# Patient Record
Sex: Female | Born: 1995 | Hispanic: No | Marital: Married | Smoking: Never smoker
Health system: Southern US, Community
[De-identification: ages and names within clinical notes are randomized; demographics above are authoritative.]

## PROBLEM LIST (undated history)

## (undated) DIAGNOSIS — E559 Vitamin D deficiency, unspecified: Secondary | ICD-10-CM

## (undated) HISTORY — PX: SKIN BIOPSY: SHX1

## (undated) HISTORY — DX: Vitamin D deficiency, unspecified: E55.9

---

## 2019-08-03 ENCOUNTER — Emergency Department (HOSPITAL_BASED_OUTPATIENT_CLINIC_OR_DEPARTMENT_OTHER): Payer: No Typology Code available for payment source

## 2019-08-03 ENCOUNTER — Emergency Department (HOSPITAL_BASED_OUTPATIENT_CLINIC_OR_DEPARTMENT_OTHER)
Admission: EM | Admit: 2019-08-03 | Discharge: 2019-08-04 | Disposition: A | Discharge status: Home / Self Care | Payer: No Typology Code available for payment source | Attending: Emergency Medicine | Admitting: Emergency Medicine

## 2019-08-03 ENCOUNTER — Encounter (HOSPITAL_BASED_OUTPATIENT_CLINIC_OR_DEPARTMENT_OTHER): Payer: Self-pay

## 2019-08-03 ENCOUNTER — Other Ambulatory Visit: Payer: Self-pay

## 2019-08-03 DIAGNOSIS — R0789 Other chest pain: Secondary | ICD-10-CM | POA: Insufficient documentation

## 2019-08-03 DIAGNOSIS — R1084 Generalized abdominal pain: Secondary | ICD-10-CM

## 2019-08-03 DIAGNOSIS — R109 Unspecified abdominal pain: Secondary | ICD-10-CM | POA: Diagnosis present

## 2019-08-03 LAB — COMPREHENSIVE METABOLIC PANEL
ALT: 19 U/L (ref 0–44)
AST: 22 U/L (ref 15–41)
Albumin: 4.8 g/dL (ref 3.5–5.0)
Alkaline Phosphatase: 48 U/L (ref 38–126)
Anion gap: 9 (ref 5–15)
BUN: 11 mg/dL (ref 6–20)
CO2: 26 mmol/L (ref 22–32)
Calcium: 9.3 mg/dL (ref 8.9–10.3)
Chloride: 103 mmol/L (ref 98–111)
Creatinine, Ser: 0.64 mg/dL (ref 0.44–1.00)
GFR calc Af Amer: >60 mL/min (ref >60)
GFR calc non Af Amer: >60 mL/min (ref >60)
Glucose, Bld: 103 mg/dL — ABNORMAL HIGH (ref 70–99)
Potassium: 4.1 mmol/L (ref 3.5–5.1)
Sodium: 138 mmol/L (ref 135–145)
Total Bilirubin: 0.6 mg/dL (ref 0.3–1.2)
Total Protein: 8.3 g/dL — ABNORMAL HIGH (ref 6.5–8.1)

## 2019-08-03 LAB — CBC
HCT: 40.7 % (ref 36.0–46.0)
Hemoglobin: 12.6 g/dL (ref 12.0–15.0)
MCH: 25.8 pg — ABNORMAL LOW (ref 26.0–34.0)
MCHC: 31 g/dL (ref 30.0–36.0)
MCV: 83.2 fL (ref 80.0–100.0)
Platelets: 300 10*3/uL (ref 150–400)
RBC: 4.89 MIL/uL (ref 3.87–5.11)
RDW: 13.1 % (ref 11.5–15.5)
WBC: 8.1 10*3/uL (ref 4.0–10.5)
nRBC: 0 % (ref 0.0–0.2)

## 2019-08-03 LAB — LIPASE, BLOOD: Lipase: 29 U/L (ref 11–51)

## 2019-08-03 LAB — TROPONIN I (HIGH SENSITIVITY): Troponin I (High Sensitivity): <2 ng/L (ref <18)

## 2019-08-03 LAB — PREGNANCY, URINE: Preg Test, Ur: NEGATIVE

## 2019-08-03 MED ORDER — ONDANSETRON HCL 4 MG/2ML IJ SOLN
4.0000 mg | Freq: Once | INTRAMUSCULAR | Status: AC
  Administered 2019-08-03: 4 mg via INTRAVENOUS
  Filled 2019-08-03: qty 2

## 2019-08-03 MED ORDER — MORPHINE SULFATE (PF) 2 MG/ML IV SOLN
2.0000 mg | Freq: Once | INTRAVENOUS | Status: AC
  Administered 2019-08-03: 2 mg via INTRAVENOUS
  Filled 2019-08-03: qty 1

## 2019-08-03 MED ORDER — IOHEXOL 300 MG/ML  SOLN
100.0000 mL | Freq: Once | INTRAMUSCULAR | Status: AC | PRN
  Administered 2019-08-03: 100 mL via INTRAVENOUS

## 2019-08-03 NOTE — Discharge Instructions (Signed)
Follow up with your doctor.  Return for worsening pain, fever, inability to eat or drink.  

## 2019-08-03 NOTE — ED Triage Notes (Signed)
Pt started having nausea yesterday followed by RLQ abd pain and mid sternal CP. Denies vomiting or diarrhea. Pt states abd pain is better with bending over. CP is better with rest. Both pains feel "achy" but gets "sharp" at times.

## 2019-08-03 NOTE — ED Provider Notes (Signed)
[facility] EMERGENCY DEPARTMENT Provider Note   CSN: [id number] Arrival date & time: 08/03/19  2103     History Chief Complaint  Patient presents with  . Abdominal Pain  . Chest Pain    Barbara Newman is a 24 y.o. female presenting for evaluation of abdominal pain, nausea.  Patient states yesterday she developed nausea and dull abdominal pain. Pain is in the right lower quadrant. Today her pain became sharp. Is worse with movement such as ambulation and bending. It is mild at rest. Patient states when her pain is severe she becomes more nauseous and then feels pain radiating from her stomach to her mid chest. She has no pain at other points. She denies fevers, chills, sore throat, cough, shortness of breath, left-sided abdominal pain, urinary symptoms, abnormal bowel movements. Patient states that when she strained to have a bowel movement in order to urinate today she had increased pain. She is sexually active with 1 female partner. They do not use condoms and she is not on birth control. She denies vaginal discharge, states he has no penile symptoms. She has not taken anything for her symptoms including Tylenol or ibuprofen. Denies previous history of abdominal surgeries. Her last period ended 6 days ago, it was normal for her.  HPI     History reviewed. No pertinent past medical history.  There are no problems to display for this patient.   History reviewed. No pertinent surgical history.   OB History   No obstetric history on file.     No family history on file.  Social History   Tobacco Use  . Smoking status: Never Smoker  . Smokeless tobacco: Never Used  Substance Use Topics  . Alcohol use: Never  . Drug use: Never    Home Medications Prior to Admission medications   Not on File    Allergies    Patient has no known allergies.  Review of Systems   Review of Systems  Gastrointestinal: Positive for abdominal pain and nausea.  All other systems  reviewed and are negative.   Physical Exam Updated Vital Signs BP 107/74   Pulse 69   Temp 98.7 F (37.1 C) (Oral)   Resp 15   Ht 5\' 6"  (1.676 m)   Wt 71.2 kg   LMP 07/28/2019   SpO2 100%   BMI 25.34 kg/m   Physical Exam Vitals and nursing note reviewed.  Constitutional:      General: She is not in acute distress.    Appearance: She is well-developed.  HENT:     Head: Normocephalic and atraumatic.  Eyes:     Conjunctiva/sclera: Conjunctivae normal.     Pupils: Pupils are equal, round, and reactive to light.  Cardiovascular:     Rate and Rhythm: Normal rate and regular rhythm.     Pulses: Normal pulses.  Pulmonary:     Effort: Pulmonary effort is normal. No respiratory distress.     Breath sounds: Normal breath sounds. No wheezing.  Abdominal:     General: There is no distension.     Palpations: Abdomen is soft. There is no mass.     Tenderness: There is abdominal tenderness. There is no guarding or rebound.     Comments: Tenderness palpation of right lower quadrant abdomen. No rigidity, guarding, distention. Negative rebound. Positive Rovsing's. Negative Murphy's. No CVA tenderness.  Musculoskeletal:        General: Normal range of motion.     Cervical back: Normal range of motion  and neck supple.  Skin:    General: Skin is warm and dry.     Capillary Refill: Capillary refill takes less than 2 seconds.  Neurological:     Mental Status: She is alert and oriented to person, place, and time.     ED Results / Procedures / Treatments   Labs (all labs ordered are listed, but only abnormal results are displayed) Labs Reviewed  CBC - Abnormal; Notable for the following components:      Result Value   MCH 25.8 (*)    All other components within normal limits  COMPREHENSIVE METABOLIC PANEL - Abnormal; Notable for the following components:   Glucose, Bld 103 (*)    Total Protein 8.3 (*)    All other components within normal limits  PREGNANCY, URINE  LIPASE, BLOOD    TROPONIN I (HIGH SENSITIVITY)    EKG EKG Interpretation  Date/Time:  Saturday August 03 2019 21:17:10 EST Ventricular Rate:  79 PR Interval:  160 QRS Duration: 78 QT Interval:  364 QTC Calculation: 417 R Axis:   70 Text Interpretation: Sinus rhythm with marked sinus arrhythmia Otherwise normal ECG No old tracing to compare Confirmed by Meridee Score [phone]) on 08/03/2019 9:21:51 PM   Radiology DG Chest 2 View  Result Date: 08/03/2019 CLINICAL DATA:  24 year old female with chest pain. EXAM: CHEST - 2 VIEW COMPARISON:  None. FINDINGS: The heart size and mediastinal contours are within normal limits. Both lungs are clear. The visualized skeletal structures are unremarkable. IMPRESSION: No active cardiopulmonary disease. Electronically Signed   By: Elgie Collard M.D.   On: 08/03/2019 21:42    Procedures Procedures (including critical care time)  Medications Ordered in ED Medications  ondansetron (ZOFRAN) injection 4 mg (4 mg Intravenous Given 08/03/19 2236)  morphine 2 MG/ML injection 2 mg (2 mg Intravenous Given 08/03/19 2236)  iohexol (OMNIPAQUE) 300 MG/ML solution 100 mL (100 mLs Intravenous Contrast Given 08/03/19 2303)    ED Course  I have reviewed the triage vital signs and the nursing notes.  Pertinent labs & imaging results that were available during my care of the patient were reviewed by me and considered in my medical decision making (see chart for details).    MDM Rules/Calculators/A&P                      Patient presenting for evaluation of nausea and abdominal pain. Physical exam shows patient who appears uncomfortable, but otherwise nontoxic. She has tenderness palpation the right lower quadrant with a positive Rovsing's. As she has associated nausea, consider appendicitis. Also consider ovarian cyst. Lower suspicion for torsion as pain is higher in the abdomen, not so much pelvic. Will obtain pregnancy to rule out ectopic. No vaginal discharge to indicate  PID or infection. Patient reported chest pain to triage RN, however on further investigation this pain is present when her abdominal pain is severe and she is feeling nauseous. As such, doubt ACS or cardiac cause for symptoms. X-ray obtained from triage read interpreted by me, no pneumonia pneumothorax, effusion, cardiomegaly. Labs obtained from triage reassuring. No leukocytosis. Kidney, liver, pancreatic function normal. Troponin negative. I do not believe he needs repeat. Will order CT scan for further evaluation of appendicitis vs ovarian cyst.  Patient signed out to D. Adela Lank, MD for follow-up on CT.  Final Clinical Impression(s) / ED Diagnoses Final diagnoses:  None    Rx / DC Orders ED Discharge Orders    None  Franchot Heidelberg, PA-C 08/03/19 [place], [place], DO 08/04/19 0000

## 2021-07-12 IMAGING — CT CT ABD-PELV W/ CM
2 of 4 series · 16 of 46 positions shown, 18 images · IV contrast (Omnipaque)
Comparison: 02/20/2019

CLINICAL DATA: Nausea and vomiting. Right lower quadrant and mid
sternal chest pain today.

EXAM:
CT ABDOMEN AND PELVIS WITH CONTRAST
TECHNIQUE: Multidetector CT imaging of the abdomen and pelvis was performed
using the standard protocol following bolus administration of
intravenous contrast.
CONTRAST:  100mL OMNIPAQUE IOHEXOL 300 MG/ML  SOLN

[Series 2: axial st · axial · 0.82mm/px · z∈[-488,-72]mm · 13 of 91 slices shown, 15 images]
[im 4/91  soft-tissue]
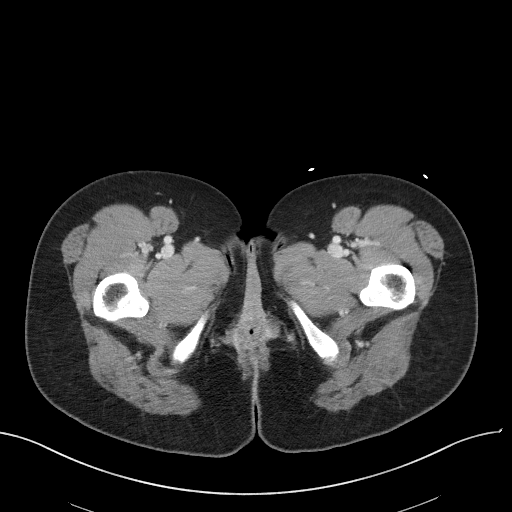
[im 4/91  bone]
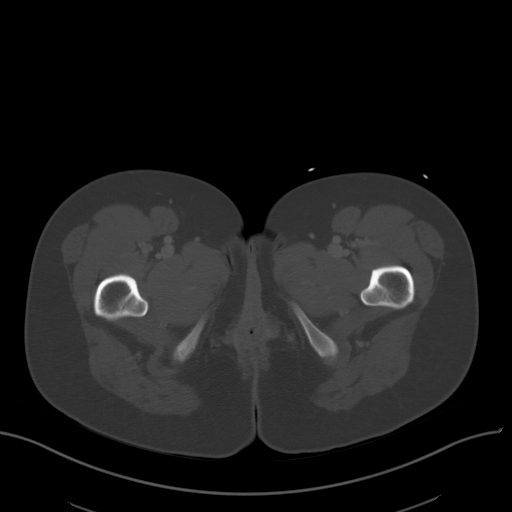
[im 12/91  soft-tissue]
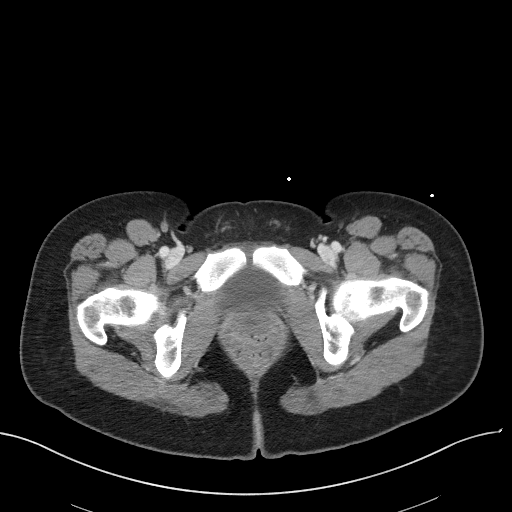
[im 19/91  soft-tissue]
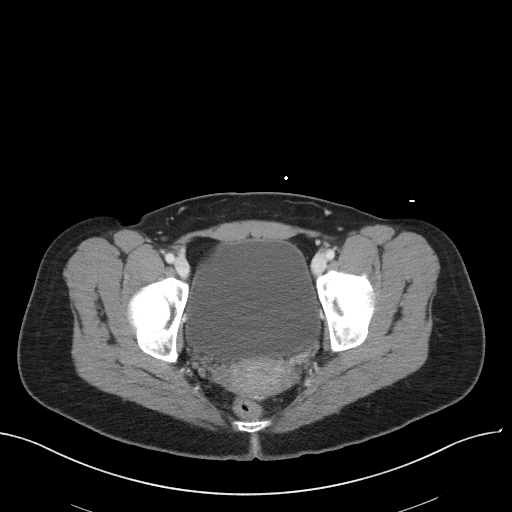
[im 27/91  soft-tissue]
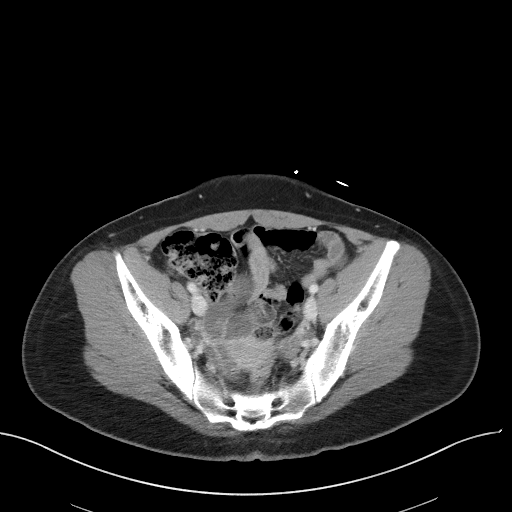
[im 31/91  soft-tissue]
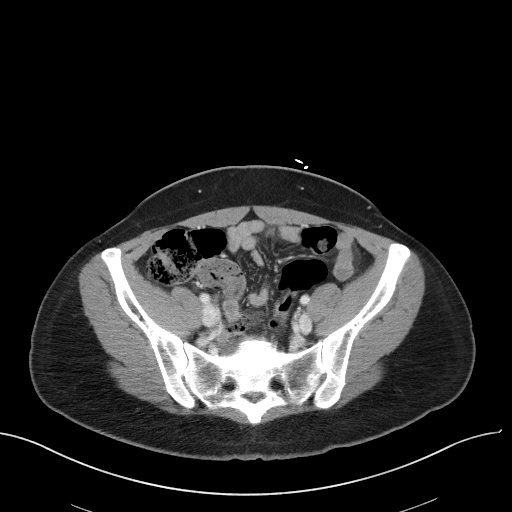
[im 38/91  soft-tissue]
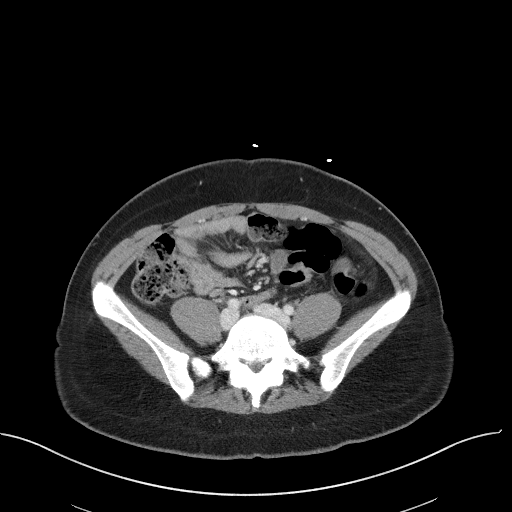
[im 46/91  soft-tissue]
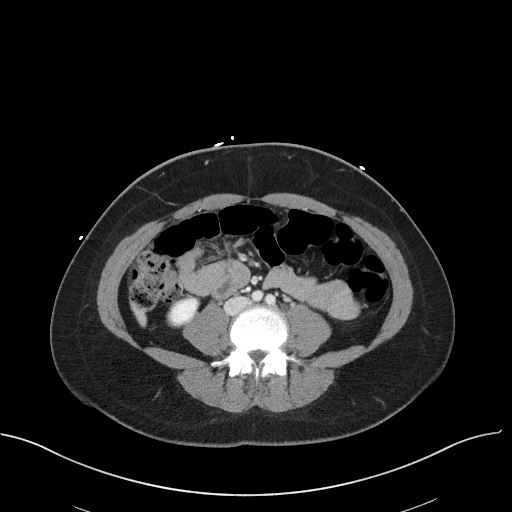
[im 53/91  soft-tissue]
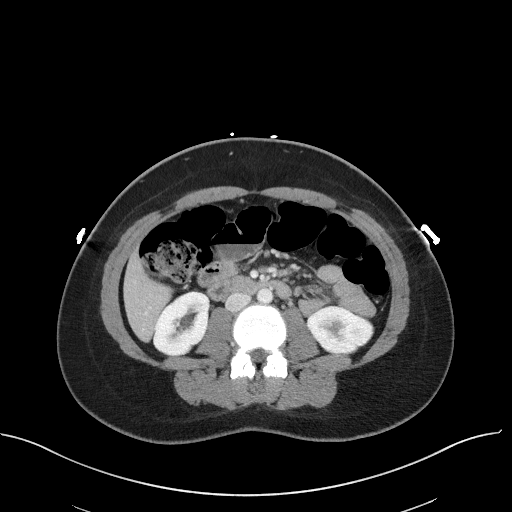
[im 61/91  soft-tissue]
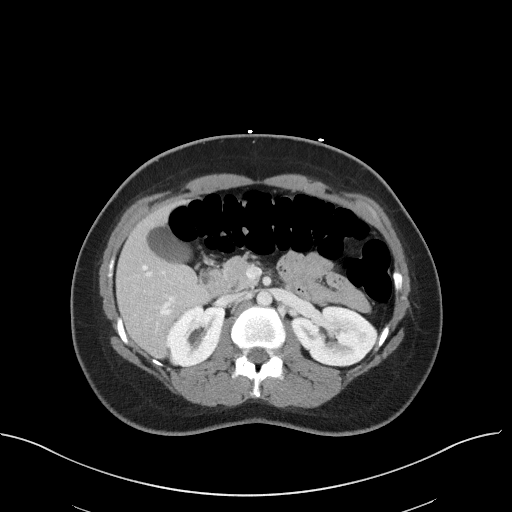
[im 61/91  bone]
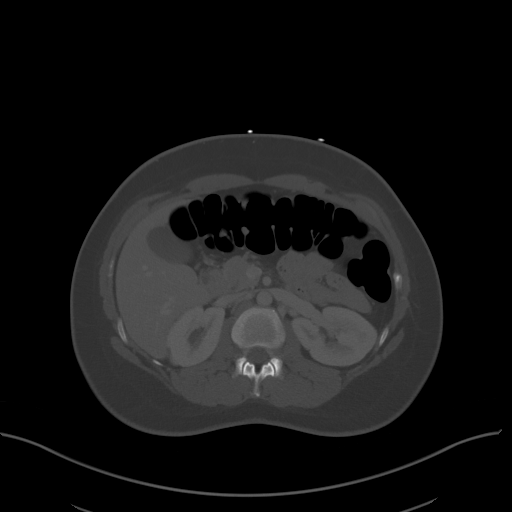
[im 64/91  soft-tissue]
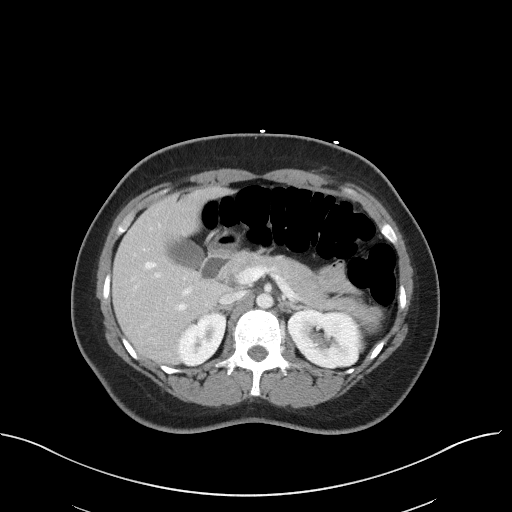
[im 72/91  soft-tissue]
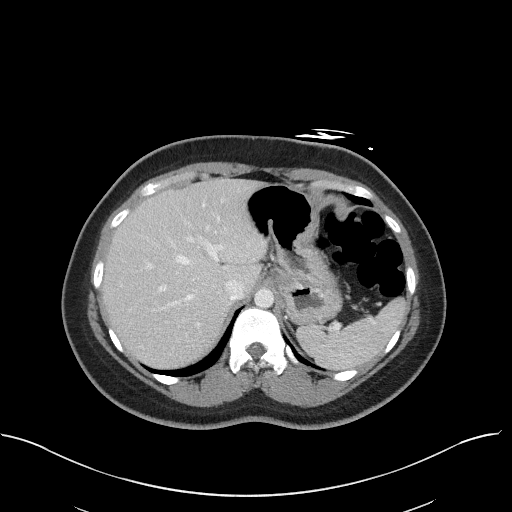
[im 79/91  soft-tissue]
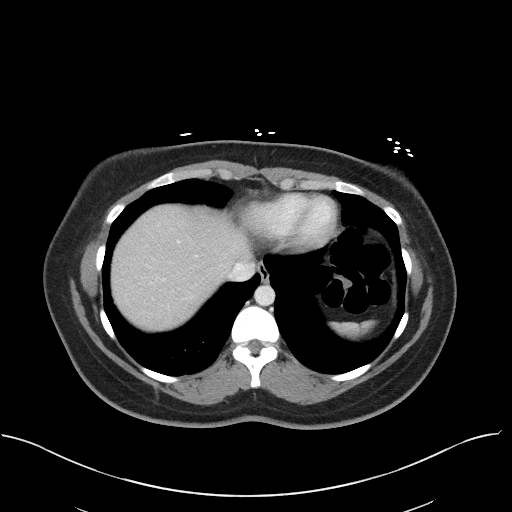
[im 87/91  soft-tissue]
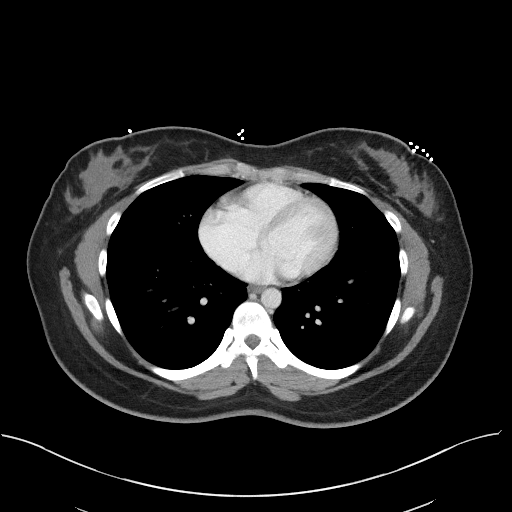

[Series 5: coronal st · coronal · 0.74mm/px · 3 of 75 slices shown]
[im 25/75  soft-tissue]
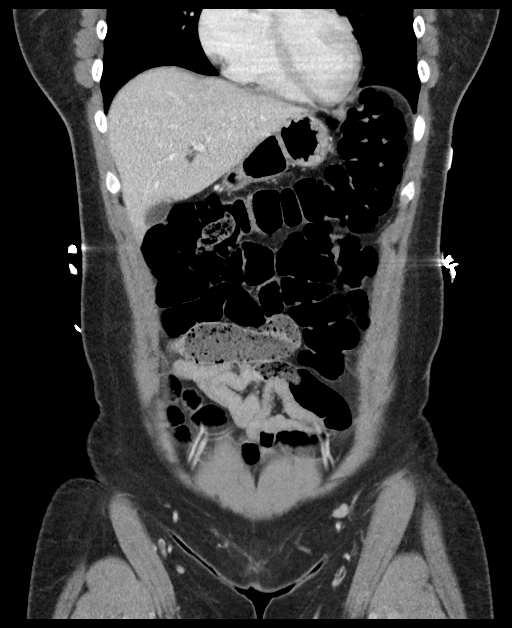
[im 33/75  soft-tissue]
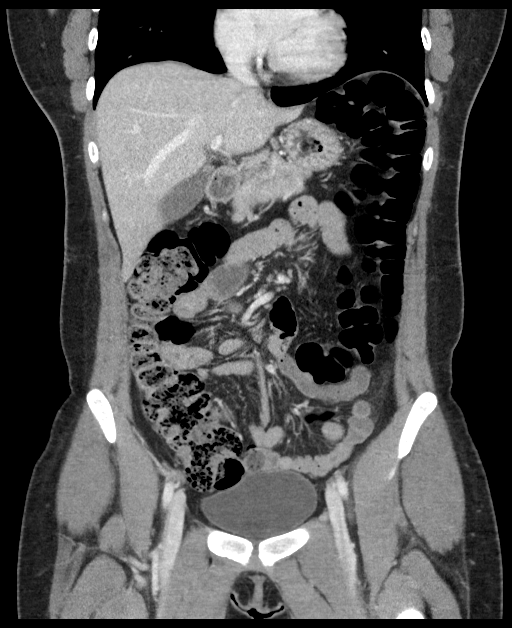
[im 42/75  soft-tissue]
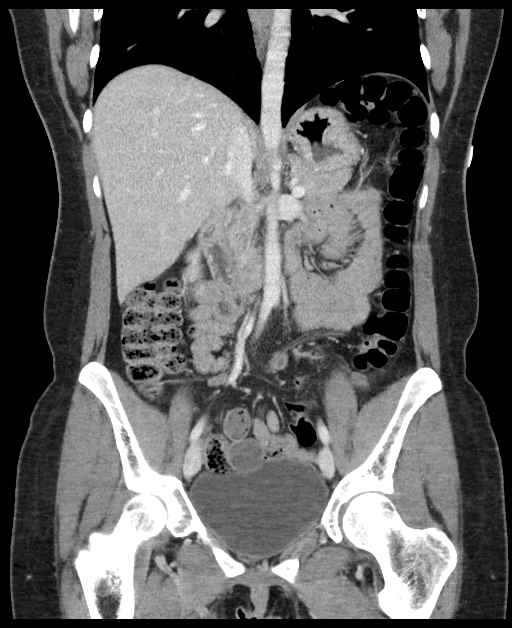

[16 of 46 positions shown; findings below may reference images not displayed]

FINDINGS: Lower chest: The lung bases are clear. The heart size is normal.

Hepatobiliary: The liver is normal. Normal gallbladder.There is no
biliary ductal dilation.

Pancreas: Normal contours without ductal dilatation. No
peripancreatic fluid collection.

Spleen: No splenic laceration or hematoma.

Adrenals/Urinary Tract:

--Adrenal glands: No adrenal hemorrhage.

--Right kidney/ureter: No hydronephrosis or perinephric hematoma.

--Left kidney/ureter: No hydronephrosis or perinephric hematoma.

--Urinary bladder: Unremarkable.

Stomach/Bowel:

--Stomach/Duodenum: No hiatal hernia or other gastric abnormality.
Normal duodenal course and caliber.

--Small bowel: There are few mildly dilated loops of small bowel in
the low midline abdomen that demonstrate fecalization. There is no
evidence for high-grade small bowel obstruction.

--Colon: No focal abnormality.

--Appendix: Normal.

Vascular/Lymphatic: Normal course and caliber of the major abdominal
vessels.

--No retroperitoneal lymphadenopathy.

--No mesenteric lymphadenopathy.

--No pelvic or inguinal lymphadenopathy.

Reproductive: Unremarkable

Other: There is a small volume of pelvic free fluid which is likely
physiologic. No free air. The abdominal wall is normal.

Musculoskeletal. No acute displaced fractures.
IMPRESSION: 1. There are few mildly dilated loops of small bowel in the abdomen
demonstrating findings of slow transit. There is no evidence for
small bowel obstruction.
2. Normal appendix.
3. Small volume pelvic free fluid is likely physiologic.

## 2022-04-06 ENCOUNTER — Other Ambulatory Visit: Payer: Self-pay

## 2022-04-06 ENCOUNTER — Emergency Department (HOSPITAL_COMMUNITY): Payer: Self-pay

## 2022-04-06 ENCOUNTER — Emergency Department (HOSPITAL_COMMUNITY)
Admission: EM | Admit: 2022-04-06 | Discharge: 2022-04-06 | Disposition: A | Discharge status: Home / Self Care | Payer: Self-pay | Attending: Emergency Medicine | Admitting: Emergency Medicine

## 2022-04-06 DIAGNOSIS — R55 Syncope and collapse: Secondary | ICD-10-CM | POA: Insufficient documentation

## 2022-04-06 DIAGNOSIS — N9489 Other specified conditions associated with female genital organs and menstrual cycle: Secondary | ICD-10-CM | POA: Insufficient documentation

## 2022-04-06 DIAGNOSIS — R531 Weakness: Secondary | ICD-10-CM | POA: Insufficient documentation

## 2022-04-06 DIAGNOSIS — Z20822 Contact with and (suspected) exposure to covid-19: Secondary | ICD-10-CM | POA: Insufficient documentation

## 2022-04-06 LAB — RESP PANEL BY RT-PCR (FLU A&B, COVID) ARPGX2
Influenza A by PCR: NEGATIVE
Influenza B by PCR: NEGATIVE
SARS Coronavirus 2 by RT PCR: NEGATIVE

## 2022-04-06 LAB — BASIC METABOLIC PANEL
Anion gap: 5 (ref 5–15)
BUN: 11 mg/dL (ref 6–20)
CO2: 26 mmol/L (ref 22–32)
Calcium: 9.2 mg/dL (ref 8.9–10.3)
Chloride: 108 mmol/L (ref 98–111)
Creatinine, Ser: 0.73 mg/dL (ref 0.44–1.00)
GFR, Estimated: >60 mL/min (ref >60)
Glucose, Bld: 104 mg/dL — ABNORMAL HIGH (ref 70–99)
Potassium: 3.8 mmol/L (ref 3.5–5.1)
Sodium: 139 mmol/L (ref 135–145)

## 2022-04-06 LAB — CBC WITH DIFFERENTIAL/PLATELET
Abs Immature Granulocytes: 0.01 10*3/uL (ref 0.00–0.07)
Basophils Absolute: 0 10*3/uL (ref 0.0–0.1)
Basophils Relative: 0 %
Eosinophils Absolute: 0.3 10*3/uL (ref 0.0–0.5)
Eosinophils Relative: 4 %
HCT: 36.8 % (ref 36.0–46.0)
Hemoglobin: 11.7 g/dL — ABNORMAL LOW (ref 12.0–15.0)
Immature Granulocytes: 0 %
Lymphocytes Relative: 31 %
Lymphs Abs: 2.2 10*3/uL (ref 0.7–4.0)
MCH: 25.8 pg — ABNORMAL LOW (ref 26.0–34.0)
MCHC: 31.8 g/dL (ref 30.0–36.0)
MCV: 81.2 fL (ref 80.0–100.0)
Monocytes Absolute: 0.4 10*3/uL (ref 0.1–1.0)
Monocytes Relative: 6 %
Neutro Abs: 4.1 10*3/uL (ref 1.7–7.7)
Neutrophils Relative %: 59 %
Platelets: 271 10*3/uL (ref 150–400)
RBC: 4.53 MIL/uL (ref 3.87–5.11)
RDW: 13.4 % (ref 11.5–15.5)
WBC: 7 10*3/uL (ref 4.0–10.5)
nRBC: 0 % (ref 0.0–0.2)

## 2022-04-06 LAB — I-STAT BETA HCG BLOOD, ED (MC, WL, AP ONLY): I-stat hCG, quantitative: <5 m[IU]/mL (ref <5)

## 2022-04-06 LAB — CBG MONITORING, ED: Glucose-Capillary: 99 mg/dL (ref 70–99)

## 2022-04-06 LAB — TROPONIN I (HIGH SENSITIVITY)
Troponin I (High Sensitivity): <2 ng/L (ref <18)
Troponin I (High Sensitivity): <2 ng/L (ref <18)

## 2022-04-06 LAB — D-DIMER, QUANTITATIVE: D-Dimer, Quant: 0.31 ug/mL-FEU (ref 0.00–0.50)

## 2022-04-06 MED ORDER — SODIUM CHLORIDE 0.9 % IV BOLUS
1000.0000 mL | Freq: Once | INTRAVENOUS | Status: AC
  Administered 2022-04-06: 1000 mL via INTRAVENOUS

## 2022-04-06 NOTE — ED Triage Notes (Signed)
Ems brings pt in for generalized weakness and chills. States this started this morning.

## 2022-04-06 NOTE — Discharge Instructions (Signed)
At this time there does not appear to be the presence of an emergent medical condition, however there is always the potential for conditions to change. Please read and follow the below instructions.  Please return to the Emergency Department immediately for any new or worsening symptoms or if your symptoms return. Please be sure to follow up with your Primary Care Provider within one week regarding your visit today; please call their office to schedule an appointment even if you are feeling better for a follow-up visit.    Please read the additional information packets attached to your discharge summary.  Go to the nearest Emergency Department immediately if: You have fever or chills You hit your head or are injured after fainting. You have any of these symptoms: Fast or uneven heartbeats (palpitations). Pain in your chest, belly, or back. Shortness of breath. You have jerky movements that you cannot control (seizure). You have a very bad headache. You are confused. You have problems with how you see (vision). You are very weak. You have trouble walking. You are bleeding from your mouth or your butt (rectum). You have black or tarry poop (stool). You have any new/concerning or worsening of symptoms.  Do not take your medicine if  develop an itchy rash, swelling in your mouth or lips, or difficulty breathing; call 911 and seek immediate emergency medical attention if this occurs.  You may review your lab tests and imaging results in their entirety on your MyChart account.  Please discuss all results of fully with your primary care provider and other specialist at your follow-up visit.  Note: Portions of this text may have been transcribed using voice recognition software. Every effort was made to ensure accuracy; however, inadvertent computerized transcription errors may still be present.

## 2022-04-06 NOTE — ED Provider Notes (Signed)
[facility] DEPT Provider Note   CSN: [id number] Arrival date & time: 04/06/22  7106     History  Chief Complaint  Patient presents with   Weakness    Barbara Newman is a 26 y.o. female reports history of vitamin D deficiency, PCOS.  Presents with her sister today for evaluation.   Patient brought in today by EMS for fatigue and multiple syncopal episodes.  Patient reports that she has been feeling well over the past few days but she has been taking care of her husband who is at home with traveler's diarrhea.  She reports that she has not been getting good sleep having to take care of him and waking up every few hours.  She reports that this morning around 3 AM she woke up when her husband began to cough, when she got out of bed to him she passed out she reports that her husband was able to catch her.  She attempted to get back up before she again lost consciousness patient believes she was unconscious for up to 2-3 minutes.  She reports that after this episode she felt generally weak/fatigued and had noticed a pressure-like sensation in the center of her chest which has been constant since onset.  She describes it as a mild pressure that does not radiate.  She denies similar in the past.   Patient denies fever, chills, cough/hemoptysis, abdominal pain, vomiting, diarrhea, dysuria, extremity swelling/color change, numbness, weakness, tingling, exogenous hormone use, hx of cancer, recent surgery/immobilization, recent travel, headache/vision changes or any additional concerns. HPI     Home Medications Prior to Admission medications   Medication Sig Start Date End Date Taking? Authorizing Provider  metFORMIN (GLUCOPHAGE-XR) 500 MG 24 hr tablet Take 500 mg by mouth daily. 01/17/22  Yes [provider]  Omega-3 Fatty Acids (FISH OIL PO) Take 1 capsule by mouth at bedtime.   Yes [provider]  Prenatal Vit-DSS-Fe Fum-FA (PRENATAL 19) tablet Take 1  tablet by mouth daily. 11/19/19  Yes [provider]  Vitamin D, Ergocalciferol, (DRISDOL) 1.25 MG (50000 UNIT) CAPS capsule Take 50,000 Units by mouth once a week. 01/25/22  Yes [provider]      Allergies    Pork-derived products    Review of Systems   Review of Systems Ten systems are reviewed and are negative for acute change except as noted in the HPI  Physical Exam Updated Vital Signs BP 111/77 Comment: Simultaneous filing. User may not have seen previous data.  Pulse 82 Comment: Simultaneous filing. User may not have seen previous data.  Temp 97.8 F (36.6 C) (Oral)   Resp 15 Comment: Simultaneous filing. User may not have seen previous data.  Ht 5\' 6"  (1.676 m)   Wt 76.2 kg   SpO2 100% Comment: Simultaneous filing. User may not have seen previous data.  BMI 27.12 kg/m  Physical Exam Constitutional:      General: She is not in acute distress.    Appearance: Normal appearance. She is well-developed. She is not ill-appearing or diaphoretic.  HENT:     Head: Normocephalic and atraumatic.  Eyes:     General: Vision grossly intact. Gaze aligned appropriately.     Pupils: Pupils are equal, round, and reactive to light.  Neck:     Trachea: Trachea and phonation normal.  Cardiovascular:     Rate and Rhythm: Normal rate and regular rhythm.     Pulses: Normal pulses.  Pulmonary:     Effort:  Pulmonary effort is normal. No respiratory distress.  Abdominal:     General: There is no distension.     Palpations: Abdomen is soft.     Tenderness: There is no abdominal tenderness. There is no guarding or rebound.  Musculoskeletal:        General: Normal range of motion.     Cervical back: Normal range of motion.     Right lower leg: No edema.     Left lower leg: No edema.  Skin:    General: Skin is warm and dry.  Neurological:     Mental Status: She is alert.     GCS: GCS eye subscore is 4. GCS verbal subscore is 5. GCS motor subscore is 6.     Comments:  Speech is clear and goal oriented, follows commands Major Cranial nerves without deficit, no facial droop Moves extremities without ataxia, coordination intact  Psychiatric:        Behavior: Behavior normal.     ED Results / Procedures / Treatments   Labs (all labs ordered are listed, but only abnormal results are displayed) Labs Reviewed  CBC WITH DIFFERENTIAL/PLATELET - Abnormal; Notable for the following components:      Result Value   Hemoglobin 11.7 (*)    MCH 25.8 (*)    All other components within normal limits  BASIC METABOLIC PANEL - Abnormal; Notable for the following components:   Glucose, Bld 104 (*)    All other components within normal limits  RESP PANEL BY RT-PCR (FLU A&B, COVID) ARPGX2  D-DIMER, QUANTITATIVE  I-STAT BETA HCG BLOOD, ED ([facility], [facility], [facility] ONLY)  CBG MONITORING, ED  TROPONIN I (HIGH SENSITIVITY)  TROPONIN I (HIGH SENSITIVITY)    EKG EKG Interpretation  Date/Time:  Wednesday April 06 2022 07:29:56 EDT Ventricular Rate:  90 PR Interval:  177 QRS Duration: 90 QT Interval:  361 QTC Calculation: 442 R Axis:   64 Text Interpretation: Sinus rhythm No significant change since last tracing Confirmed by Melene Plan [phone]) on 04/06/2022 9:04:43 AM  Radiology DG Chest 2 View  Result Date: 04/06/2022 CLINICAL DATA:  Syncopal episode. EXAM: CHEST - 2 VIEW COMPARISON:  Chest x-ray 08/03/2019 FINDINGS: The cardiac silhouette, mediastinal and hilar contours are within normal limits and stable. Low lung volumes with vascular crowding and bibasilar atelectasis but no definite infiltrates or effusions. No pulmonary lesions. The bony thorax is intact. IMPRESSION: Low lung volumes with vascular crowding and bibasilar atelectasis. Electronically Signed   By: Rudie Meyer M.D.   On: 04/06/2022 08:17    Procedures Procedures    Medications Ordered in ED Medications  sodium chloride 0.9 % bolus 1,000 mL (0 mLs Intravenous Stopped 04/06/22 1015)    ED Course/  Medical Decision Making/ A&P Clinical Course as of 04/06/22 1128  Wed Apr 06, 2022  0900 D-dimer, quantitative D-dimer within normal limits.  Low suspicion for pulmonary embolism. [BM]  0900 Troponin I (High Sensitivity) High-sensitivity troponin within normal limits, low suspicion for ACS [BM]  0900 I-Stat Beta hCG blood, ED ([facility], [facility], [facility] only) Pregnancy test negative [BM]  0900 POC CBG, ED CBG 99, no evidence for hypoglycemia [BM]  0900 Resp Panel by RT-PCR (Flu A&B, Covid) Anterior Nasal Swab COVID/influenza panel negative [BM]  0900 Basic metabolic panel(!) BMP shows no emergent electrolyte derangement, AKI or gap [BM]  0900 CBC with Differential(!) CBC shows mild anemia of 11.7, low suspicion for symptomatic anemia.  CBC from 2 years ago show hemoglobin of 12.6.  No  leukocytosis to suggest infectious process.  No thrombocytopenia. [BM]  0900 EKG 12-Lead I have personally reviewed and interpreted patient's twelve-lead EKG.  I not appreciate any obvious acute ischemic changes.  Shows normal sinus rhythm with rate of 90 bpm. [BM]  0900 DG Chest 2 View I have personally reviewed and interpreted patient's two-view chest x-ray.  I do not appreciate any obvious PTX or PNA. [BM]  0913 Patient reassessed she is resting company bed no acute distress, sister at bedside.  Patient reports that she is feeling much better after receiving IV fluids.  She denies any chest pain/pressure.  No shortness of breath.  Vital signs within normal limits on room air. [BM]  0935 No orthostatic hypotension [BM]  1119 Troponin I (High Sensitivity) Delta troponin within normal limits, doubt ACS. [BM]    Clinical Course User Index [BM] Elizabeth Palau                           Medical Decision Making 26 year old female presented for fatigue and syncopal episodes occurred earlier this morning.  Patient had woken up from sleep to take care of her husband who is sick with traveler's diarrhea.  Patient had a  syncopal episode she was caught by her husband and did not fall to the ground or injure herself.  When she tried to get up she again lost consciousness.  Patient reports that after she regained consciousness she noticed a chest pressure as well as nausea.  She denies any additional injuries or concerns.  She denies recent illness or episodes of vomiting/diarrhea.  She reports that she has been increasingly tired over the past few days due to taking care of her husband who has been sick.  Differential includes but not limited to anemia, arrhythmia, hypoglycemia, PE, ectopic pregnancy, dissection, orthostatic hypotension, seizure, vasovagal.  Amount and/or Complexity of Data Reviewed Independent Historian:     Details: Patient's sister at bedside corroborates patient story Labs: ordered. Decision-making details documented in ED Course. Radiology: ordered. Decision-making details documented in ED Course. ECG/medicine tests: ordered. Decision-making details documented in ED Course.  Risk Risk Details: Patient's work-up today is overall reassuring.  Her symptoms have resolved since ER arrival with IV fluids.  She has no chest pain or shortness of breath.  Low suspicion for ACS, PE, dissection, SAH or other emergent causes of syncope at this time.  Orthostatics obtained and were within normal limits.  On reassessment patient reports she is feeling well and like to be discharged.  Suspect patient's fatigue and syncope may have been due to her lack of sleep taking care of her husband over the past several days.  I advised patient to follow-up closely with her primary care provider for recheck.  Strict ER precautions were discussed with patient and her sister at bedside. Case was discussed with Dr. Adela Lank today who agrees with discharge at this time. - 11:20 AM: Patient reassessed she is resting comfortably in bed no acute distress.  She reports she is feeling much better she had some hashbrowns that her sister  brought in.  She reports she feels tired but reports has been only given 1-2 hours of sleep over the past few nights with taking care of her husband.  Suspect patient's fatigue and lack of sleep to her symptoms today.  Patient is low risk by [facility] syncope rule.  I encouraged patient to follow-up with her primary care provider this week for recheck.  I discussed strict ER return precautions patient and her sister at bedside.  All questions were answered.    At this time there does not appear to be any evidence of an acute emergency medical condition and the patient appears stable for discharge with appropriate outpatient follow up. Diagnosis was discussed with patient who verbalizes understanding of care plan and is agreeable to discharge. I have discussed return precautions with patient who verbalizes understanding. Patient encouraged to follow-up with their PCP. All questions answered.   Note: Portions of this report may have been transcribed using voice recognition software. Every effort was made to ensure accuracy; however, inadvertent computerized transcription errors may still be present.         Final Clinical Impression(s) / ED Diagnoses Final diagnoses:  Generalized weakness  Syncope, unspecified syncope type    Rx / DC Orders ED Discharge Orders     None         Elizabeth PalauMorelli, Cohl Behrens A, PA-C 04/06/22 1128    Melene PlanFloyd, Dan, DO 04/06/22 1203

## 2022-04-18 ENCOUNTER — Encounter: Payer: Self-pay | Admitting: Nurse Practitioner

## 2022-04-18 ENCOUNTER — Ambulatory Visit (INDEPENDENT_AMBULATORY_CARE_PROVIDER_SITE_OTHER): Payer: Commercial Managed Care - HMO | Admitting: Nurse Practitioner

## 2022-04-18 VITALS — BP 112/70 | HR 70 | Temp 98.0°F | Ht 66.0 in | Wt 169.6 lb

## 2022-04-18 DIAGNOSIS — R55 Syncope and collapse: Secondary | ICD-10-CM | POA: Diagnosis not present

## 2022-04-18 DIAGNOSIS — E785 Hyperlipidemia, unspecified: Secondary | ICD-10-CM | POA: Diagnosis not present

## 2022-04-18 DIAGNOSIS — Z8249 Family history of ischemic heart disease and other diseases of the circulatory system: Secondary | ICD-10-CM

## 2022-04-18 DIAGNOSIS — Z3183 Encounter for assisted reproductive fertility procedure cycle: Secondary | ICD-10-CM

## 2022-04-18 NOTE — Progress Notes (Signed)
Established Patient Visit  Patient: Barbara Newman   DOB: 05-31-1996   26 y.o. Female  MRN: [id number] Visit Date: 04/18/2022  Subjective:    Chief Complaint  Patient presents with   Establish Care    New pt , est care ED f/u on 04/06/22, says things have been the same w/o relief    Previous pcp and GYN with [facility]. Change of providers due to change in insurance coverage.  HPI Syncope and collapse 2syncopal episodes: 1st episode 04/06/22 (witnessed by husband, <5sec, at 5:30am woke up from supine position on couch, walked to bedroom to check of sick husband who was coughing, she felt chills and palpitation prior to LOC). 2nd episode 04/15/22 (witnessed by husband, <15sec, at 9am woke up from bed, walked to the living room, felt chills, dizziness and palpitation prior to LOC, after regaining consciousness she became nausea and threw up).  She reports feeling very fatigued after each episode. She denies hx of seizure or head injury, denies skipping meals, no ETOH, no illicit drug use, caffeine intake 1cup of coffee/tea per day, H2O intake 3-4 16oz bottles per day. Started metformin 5476m daily by GYN 01/2022 due to possible PCOS, last hgbA1c at 5.6% TSH and iron panel by previous pcp 01/2022: normal Reviewed labs completed during ED visit on 04/06/22: no acute finding.  Possibly orthostatic hypotension? orthostatic VS today: unremarkable but she reported lightheadedness from sitting to standing position. Entered referral to neurology  Family history of premature CAD Mother at age 6031and maternal uncle at age 28 ECG done in ED 04/06/2022: NSR, normal intervals Entered cardiology referral in light of recent syncopal episodes  Encounter for assisted reproductive fertility cycle Married 2020, no use of contraception, no previous pregnancy. She reports she was diagnosed with PCOS 393yrago by [place] every 21-22days, bleeding for 3-4days. Denies  any hirsutism. Pelvic [place] 05/2019: normal. No hormonal testing done in past. She was started on metformin 01/2022 to help prolong menstrual cycle. She reports cycles are now every every 28days. Has been tracking menstrual cycle x 76m22monthReports ovulation kit shows high ovulation but no peak. She is not always been sexually active during ovulation period.  Entered referral to OBGYN  Dyslipidemia Cholesterol, Total  Triglycerides  HDL  VLDL Cholesterol Cal  LDL  LDL/HDL Ratio  Component 01/21/2022 11/22/2019      Cholesterol, Total 222 High    201 High     Triglycerides 229 High    243 High     HDL 41 41  VLDL Cholesterol Cal 41 High    43 High     LDL 140 High    117 High     LDL/HDL Ratio 3.4 High     2.9    Reviewed medical, surgical, and social history today Family History  Problem Relation Age of Onset   Hypertension Mother    Early death Mother 44 62Heart disease Mother    Heart attack Mother    Heart disease Maternal Aunt    Early death Maternal Aunt 40   Diabetes Paternal Uncle    Heart disease Maternal Grandmother 62   Diabetes Paternal Grandfather     Medications: Outpatient Medications Prior to Visit  Medication Sig   metFORMIN (GLUCOPHAGE-XR) 500 MG 24 hr tablet Take 500 mg by mouth daily.   Omega-3 Fatty Acids (FISH OIL PO) Take 1 capsule by  mouth at bedtime.   Prenatal Vit-DSS-Fe Fum-FA (PRENATAL 19) tablet Take 1 tablet by mouth daily.   [DISCONTINUED] metFORMIN (GLUCOPHAGE-XR) 500 MG 24 hr tablet Take 1 tablet by mouth daily with breakfast. (Patient not taking: Reported on 04/18/2022)   [DISCONTINUED] Vitamin D, Ergocalciferol, (DRISDOL) 1.25 MG (50000 UNIT) CAPS capsule Take 50,000 Units by mouth once a week. (Patient not taking: Reported on 04/18/2022)   No facility-administered medications prior to visit.   Reviewed past medical and social history.   ROS per HPI above  Last CBC Lab Results  Component Value Date   WBC 7.0 04/06/2022    HGB 11.7 (L) 04/06/2022   HCT 36.8 04/06/2022   MCV 81.2 04/06/2022   MCH 25.8 (L) 04/06/2022   RDW 13.4 04/06/2022   PLT 271 03/49/1791   Last metabolic panel Lab Results  Component Value Date   GLUCOSE 104 (H) 04/06/2022   NA 139 04/06/2022   K 3.8 04/06/2022   CL 108 04/06/2022   CO2 26 04/06/2022   BUN 11 04/06/2022   CREATININE 0.73 04/06/2022   GFRNONAA >60 04/06/2022   CALCIUM 9.2 04/06/2022   PROT 8.3 (H) 08/03/2019   ALBUMIN 4.8 08/03/2019   BILITOT 0.6 08/03/2019   ALKPHOS 48 08/03/2019   AST 22 08/03/2019   ALT 19 08/03/2019   ANIONGAP 5 04/06/2022   Last hemoglobin A1c No results found for: "HGBA1C" Last thyroid functions No results found for: "TSH", "T3TOTAL", "T4TOTAL", "THYROIDAB" Last vitamin D No results found for: "25OHVITD2", "25OHVITD3", "VD25OH"      Objective:  BP 112/70 (BP Location: Right Arm, Patient Position: Sitting, Cuff Size: Small)   Pulse 70   Temp 98 F (36.7 C) (Temporal)   Ht _0  (1.676 m)   Wt 169 lb 9.6 oz (76.9 kg)   LMP 04/12/2022 (Exact Date)   SpO2 98%   BMI 27.37 kg/m      Physical Exam Vitals reviewed.  Cardiovascular:     Rate and Rhythm: Normal rate and regular rhythm.     Pulses: Normal pulses.     Heart sounds: Normal heart sounds.  Pulmonary:     Effort: Pulmonary effort is normal.     Breath sounds: Normal breath sounds.  Musculoskeletal:     Right lower leg: No edema.     Left lower leg: No edema.  Skin:    General: Skin is warm and dry.  Neurological:     Mental Status: She is alert and oriented to person, place, and time.     No results found for any visits on 04/18/22.    Assessment & Plan:    Problem List Items Addressed This Visit       Other   Dyslipidemia    Cholesterol, Total  Triglycerides  HDL  VLDL Cholesterol Cal  LDL  LDL/HDL Ratio  Component 01/21/2022 11/22/2019      Cholesterol, Total 222 High    201 High     Triglycerides 229 High    243 High     HDL 41 41   VLDL Cholesterol Cal 41 High    43 High     LDL 140 High    117 High     LDL/HDL Ratio 3.4 High     2.9        Relevant Orders   Ambulatory referral to Cardiology   Encounter for assisted reproductive fertility cycle    Married 2020, no use of contraception, no previous pregnancy. She reports she was diagnosed  with PCOS 85yr ago by [place] Menstrual cycle every 21-22days, bleeding for 3-4days. Denies any hirsutism. Pelvic [place] 05/2019: normal. No hormonal testing done in past. She was started on metformin 01/2022 to help prolong menstrual cycle. She reports cycles are now every every 28days. Has been tracking menstrual cycle x 640month Reports ovulation kit shows high ovulation but no peak. She is not always been sexually active during ovulation period.  Entered referral to OBGYN      Relevant Orders   Ambulatory referral to Obstetrics / Gynecology   Family history of premature CAD    Mother at age 10385nd maternal uncle at age 286ECG done in ED 04/06/2022: NSR, normal intervals Entered cardiology referral in light of recent syncopal episodes      Relevant Orders   Ambulatory referral to Cardiology   Syncope and collapse - Primary    2syncopal episodes: 1st episode 04/06/22 (witnessed by husband, <5sec, at 5:30am woke up from supine position on couch, walked to bedroom to check of sick husband who was coughing, she felt chills and palpitation prior to LOC). 2nd episode 04/15/22 (witnessed by husband, <15sec, at 9am woke up from bed, walked to the living room, felt chills, dizziness and palpitation prior to LOC, after regaining consciousness she became nausea and threw up).  She reports feeling very fatigued after each episode. She denies hx of seizure or head injury, denies skipping meals, no ETOH, no illicit drug use, caffeine intake 1cup of coffee/tea per day, H2O intake 3-4 16oz bottles per day. Started metformin 50042maily by GYN 01/2022 due to possible PCOS,  last hgbA1c at 5.6% TSH and iron panel by previous pcp 01/2022: normal Reviewed labs completed during ED visit on 04/06/22: no acute finding.  Possibly orthostatic hypotension? orthostatic VS today: unremarkable but she reported lightheadedness from sitting to standing position. Entered referral to neurology      Relevant Orders   Ambulatory referral to Cardiology   Return in about 40 weeks (around 01/23/2023) for CPE (fasting).     ChaWilfred LacyP

## 2022-04-18 NOTE — Assessment & Plan Note (Signed)
Married 2020, no use of contraception, no previous pregnancy. She reports she [place] Menstrual cycle every 21-22days, bleeding for 3-4days. Denies any hirsutism. Pelvic [place] 05/2019: normal. No hormonal testing done in past. She was started on metformin 01/2022 to help prolong menstrual cycle. She reports cycles are now every every 28days. Has been tracking menstrual cycle x 632month Reports ovulation kit shows high ovulation but no peak. She is not always been sexually active during ovulation period.  [facility]

## 2022-04-18 NOTE — Assessment & Plan Note (Addendum)
Mother at age 28 and maternal uncle at age 20. ECG done in ED 04/06/2022: NSR, normal intervals Entered cardiology referral in light of recent syncopal episodes

## 2022-04-18 NOTE — Assessment & Plan Note (Addendum)
2syncopal episodes: 1st episode 04/06/22 (witnessed by husband, <5sec, at 5:30am woke up from supine position on couch, walked to bedroom to check of sick husband who was coughing, she felt chills and palpitation prior to LOC). 2nd episode 04/15/22 (witnessed by husband, <15sec, at 9am woke up from bed, walked to the living room, felt chills, dizziness and palpitation prior to LOC, after regaining consciousness she became nausea and threw up).  She reports feeling very fatigued after each episode. She denies hx of seizure or head injury, denies skipping meals, no ETOH, no illicit drug use, caffeine intake 1cup of coffee/tea per day, H2O intake 3-4 16oz bottles per day. Started metformin 500mg  daily by GYN 01/2022 due to possible PCOS, last hgbA1c at 5.6% TSH and iron panel by previous pcp 01/2022: normal Reviewed labs completed during ED visit on 04/06/22: no acute finding.  Possibly orthostatic hypotension? orthostatic VS today: unremarkable but she reported lightheadedness from sitting to standing position. Entered referral to neurology

## 2022-04-18 NOTE — Assessment & Plan Note (Signed)
Cholesterol, Total  Triglycerides  HDL  VLDL Cholesterol Cal  LDL  LDL/HDL Ratio  Component 01/21/2022 11/22/2019      Cholesterol, Total 222High   201High    Triglycerides 229High   243High    HDL 41 41  VLDL Cholesterol Cal 41High   43High    LDL 140High   117High    LDL/HDL Ratio 3.4High    2.9

## 2022-04-18 NOTE — Patient Instructions (Signed)
Change positions slowly You will be contacted to schedule appt with GYN and cardiology  Orthostatic Hypotension Blood pressure is a measurement of how strongly, or weakly, your circulating blood is pressing against the walls of your arteries. Orthostatic hypotension is a drop in blood pressure that can happen when you change positions, such as when you go from lying down to standing. Arteries are blood vessels that carry blood from your heart throughout your body. When blood pressure is too low, you may not get enough blood to your brain or to the rest of your organs. Orthostatic hypotension can cause light-headedness, sweating, rapid heartbeat, blurred vision, and fainting. These symptoms require further investigation into the cause. What are the causes? Orthostatic hypotension can be caused by many things, including: Sudden changes in posture, such as standing up quickly after you have been sitting or lying down. Loss of blood (anemia) or loss of body fluids (dehydration). Heart problems, neurologic problems, or hormone problems. Pregnancy. Aging. The risk for this condition increases as you get older. Severe infection (sepsis). Certain medicines, such as medicines for high blood pressure or medicines that make the body lose excess fluids (diuretics). What are the signs or symptoms? Symptoms of this condition may include: Weakness, light-headedness, or dizziness. Sweating. Blurred vision. Tiredness (fatigue). Rapid heartbeat. Fainting, in severe cases. How is this diagnosed? This condition is diagnosed based on: Your symptoms and medical history. Your blood pressure measurements. Your health care provider will check your blood pressure when you are: Lying down. Sitting. Standing. A blood pressure reading is recorded as two numbers, such as "120 over 80" (or 120/80). The first ("top") number is called the systolic pressure. It is a measure of the pressure in your arteries as your heart  beats. The second ("bottom") number is called the diastolic pressure. It is a measure of the pressure in your arteries when your heart relaxes between beats. Blood pressure is measured in a unit called mmHg. Healthy blood pressure for most adults is 120/80 mmHg. Orthostatic hypotension is defined as a 20 mmHg drop in systolic pressure or a 10 mmHg drop in diastolic pressure within 3 minutes of standing. Other information or tests that may be used to diagnose orthostatic hypotension include: Your other vital signs, such as your heart rate and temperature. Blood tests. An electrocardiogram (ECG) or echocardiogram. A Holter monitor. This is a device you wear that records your heart rhythm continuously, usually for 24-48 hours. Tilt table test. For this test, you will be safely secured to a table that moves you from a lying position to an upright position. Your heart rhythm and blood pressure will be monitored during the test. How is this treated? This condition may be treated by: Changing your diet. This may involve eating more salt (sodium) or drinking more water. Changing the dosage of certain medicines you are taking that might be lowering your blood pressure. Correcting the underlying reason for the orthostatic hypotension. Wearing compression stockings. Taking medicines to raise your blood pressure. Avoiding actions that trigger symptoms. Follow these instructions at home: Medicines Take over-the-counter and prescription medicines only as told by your health care provider. Follow instructions from your health care provider about changing the dosage of your current medicines, if this applies. Do not stop or adjust any of your medicines on your own. Eating and drinking  Drink enough fluid to keep your urine pale yellow. Eat extra salt only as directed. Do not add extra salt to your diet unless advised by your health  care provider. Eat frequent, small meals. Avoid standing up suddenly after  eating. General instructions  Get up slowly from lying down or sitting positions. This gives your blood pressure a chance to adjust. Avoid hot showers and excessive heat as directed by your health care provider. Engage in regular physical activity as directed by your health care provider. If you have compression stockings, wear them as told. Keep all follow-up visits. This is important. Contact a health care provider if: You have a fever for more than 2-3 days. You feel more thirsty than usual. You feel dizzy or weak. Get help right away if: You have chest pain. You have a fast or irregular heartbeat. You become sweaty or feel light-headed. You feel short of breath. You faint. You have any symptoms of a stroke. "BE FAST" is an easy way to remember the main warning signs of a stroke: B - Balance. Signs are dizziness, sudden trouble walking, or loss of balance. E - Eyes. Signs are trouble seeing or a sudden change in vision. F - Face. Signs are sudden weakness or numbness of the face, or the face or eyelid drooping on one side. A - Arms. Signs are weakness or numbness in an arm. This happens suddenly and usually on one side of the body. S - Speech. Signs are sudden trouble speaking, slurred speech, or trouble understanding what people say. T - Time. Time to call emergency services. Write down what time symptoms started. You have other signs of a stroke, such as: A sudden, severe headache with no known cause. Nausea or vomiting. Seizure. These symptoms may represent a serious problem that is an emergency. Do not wait to see if the symptoms will go away. Get medical help right away. Call your local emergency services (911 in the [place] Do not drive yourself to the hospital. Summary Orthostatic hypotension is a sudden drop in blood pressure. It can cause light-headedness, sweating, rapid heartbeat, blurred vision, and fainting. Orthostatic hypotension can be diagnosed by having your blood  pressure taken while lying down, sitting, and then standing. Treatment may involve changing your diet, wearing compression stockings, sitting up slowly, adjusting your medicines, or correcting the underlying reason for the orthostatic hypotension. Get help right away if you have chest pain, a fast or irregular heartbeat, or symptoms of a stroke. This information is not intended to replace advice given to you by your health care provider. Make sure you discuss any questions you have with your health care provider. Document Revised: 09/17/2020 Document Reviewed: 09/17/2020 Elsevier Patient Education  2023 [employer].

## 2022-04-22 ENCOUNTER — Encounter: Payer: Self-pay | Admitting: Radiology

## 2022-04-22 ENCOUNTER — Other Ambulatory Visit (HOSPITAL_COMMUNITY)
Admission: RE | Admit: 2022-04-22 | Discharge: 2022-04-22 | Disposition: A | Discharge status: Home / Self Care | Payer: Commercial Managed Care - HMO | Source: Ambulatory Visit | Attending: Radiology | Admitting: Radiology

## 2022-04-22 ENCOUNTER — Ambulatory Visit (INDEPENDENT_AMBULATORY_CARE_PROVIDER_SITE_OTHER): Payer: Commercial Managed Care - HMO | Admitting: Radiology

## 2022-04-22 VITALS — BP 120/80 | Ht 65.5 in | Wt 170.0 lb

## 2022-04-22 DIAGNOSIS — E559 Vitamin D deficiency, unspecified: Secondary | ICD-10-CM | POA: Diagnosis not present

## 2022-04-22 DIAGNOSIS — R55 Syncope and collapse: Secondary | ICD-10-CM | POA: Diagnosis not present

## 2022-04-22 DIAGNOSIS — E782 Mixed hyperlipidemia: Secondary | ICD-10-CM

## 2022-04-22 DIAGNOSIS — Z01419 Encounter for gynecological examination (general) (routine) without abnormal findings: Secondary | ICD-10-CM | POA: Insufficient documentation

## 2022-04-22 DIAGNOSIS — Z789 Other specified health status: Secondary | ICD-10-CM

## 2022-04-22 DIAGNOSIS — D509 Iron deficiency anemia, unspecified: Secondary | ICD-10-CM

## 2022-04-22 NOTE — Progress Notes (Signed)
Marylene Jian Dec 07, 1995 [id number]   History:  26 y.o. G0 presents for annual exam as a new patient with concerns. Trying for pregnancy, currently taking metformin (PCP recommended stopping metformin due to syncopal episodes--started 2 weeks ago was evaluated in the ED). Previous GYN told her she had PCOS, because she had a cyst on u/s desires second opinion. No labs were done.  Gynecologic History Patient's last menstrual period was 04/12/2022 (exact date). Period Cycle (Days):  (prior to taking metformin cycles were every 20-21 days) Period Duration (Days): 5 Period Pattern: Regular Menstrual Flow: Heavy (heavy first 1-2 days, then gets lighter) Menstrual Control: Maxi pad Dysmenorrhea: (!) Mild Dysmenorrhea Symptoms: Cramping Contraception/Family planning: none Sexually active: yes Last Pap: 2020. Results were: normal   Obstetric History OB History  Gravida Para Term Preterm AB Living  0 0 0 0 0 0  SAB IAB Ectopic Multiple Live Births  0 0 0 0 0   Past Medical History:  Diagnosis Date   Vitamin D deficiency      The following portions of the patient's history were reviewed and updated as appropriate: allergies, current medications, past family history, past medical history, past social history, past surgical history, and problem list.  Review of Systems Pertinent items noted in HPI and remainder of comprehensive ROS otherwise negative.   Past medical history, past surgical history, family history and social history were all reviewed and documented in the EPIC chart.   Exam:  Vitals:   04/22/22 1130  BP: 120/80  Weight: 170 lb (77.1 kg)  Height: 5' 5.5" (1.664 m)   Body mass index is 27.86 kg/m.  General appearance:  Normal Thyroid:  Symmetrical, normal in size, without palpable masses or nodularity. Respiratory  Auscultation:  Clear without wheezing or rhonchi Cardiovascular  Auscultation:  Regular rate, without rubs, murmurs or gallops  Edema/varicosities:   Not grossly evident Abdominal  Soft,nontender, without masses, guarding or rebound.  Liver/spleen:  No organomegaly noted  Hernia:  None appreciated  Skin  Inspection:  Grossly normal Breasts: Examined lying and sitting.   Right: Without masses, retractions, nipple discharge or axillary adenopathy.   Left: Without masses, retractions, nipple discharge or axillary adenopathy. Genitourinary   Inguinal/mons:  Normal without inguinal adenopathy  External genitalia:  Normal appearing vulva with no masses, tenderness, or lesions  BUS/Urethra/Skene's glands:  Normal without masses or exudate  Vagina:  Normal appearing with normal color and discharge, no lesions  Cervix:  Normal appearing without discharge or lesions  Uterus:  Normal in size, shape and contour.  Mobile, nontender  Adnexa/parametria:     Rt: Normal in size, without masses or tenderness.   Lt: Normal in size, without masses or tenderness.  Anus and perineum: Normal   Patient informed chaperone available to be present for breast and pelvic exam. Patient has requested no chaperone to be present. Patient has been advised what will be completed during breast and pelvic exam.   Assessment/Plan:   1. Well woman exam with routine gynecological exam - Cytology - PAP( [facility])  2. Syncope and collapse - Thyroid Panel With TSH - HgB A1c - Comp Met (CMET)  3. Vitamin D deficiency - Vitamin D (25 hydroxy)  4. Attempting to conceive - DHEA-sulfate; Future - FSH; Future - LH; Future - Testos,Total,Free and SHBG (Female); Future - Prolactin; Future  5. Mixed hyperlipidemia  6. Iron deficiency anemia, unspecified iron deficiency anemia type - CBC - B12 and Folate Panel - Ferritin   Future labs  to be drawn on cycle day 3, will then consider if cd21 progesterone is needed.  Discussed SBE, pap screening as directed/appropriate. Recommend 185mns of exercise weekly, including weight bearing exercise. Encouraged the use of  seatbelts and sunscreen. Return in 1 year for annual or as needed.   CRubbie BattiestB WHNP-BC 11:48 AM 04/22/2022

## 2022-04-23 LAB — HEMOGLOBIN A1C
Hgb A1c MFr Bld: 5.3 % of total Hgb (ref <5.7)
Mean Plasma Glucose: 105 mg/dL
eAG (mmol/L): 5.8 mmol/L

## 2022-04-23 LAB — CBC
HCT: 39.3 % (ref 35.0–45.0)
Hemoglobin: 12.4 g/dL (ref 11.7–15.5)
MCH: 26 pg — ABNORMAL LOW (ref 27.0–33.0)
MCHC: 31.6 g/dL — ABNORMAL LOW (ref 32.0–36.0)
MCV: 82.4 fL (ref 80.0–100.0)
MPV: 9.8 fL (ref 7.5–12.5)
Platelets: 312 10*3/uL (ref 140–400)
RBC: 4.77 10*6/uL (ref 3.80–5.10)
RDW: 13.1 % (ref 11.0–15.0)
WBC: 7.4 10*3/uL (ref 3.8–10.8)

## 2022-04-23 LAB — COMPREHENSIVE METABOLIC PANEL
AG Ratio: 1.6 (calc) (ref 1.0–2.5)
ALT: 19 U/L (ref 6–29)
AST: 17 U/L (ref 10–30)
Albumin: 4.6 g/dL (ref 3.6–5.1)
Alkaline phosphatase (APISO): 52 U/L (ref 31–125)
BUN: 8 mg/dL (ref 7–25)
CO2: 26 mmol/L (ref 20–32)
Calcium: 9.5 mg/dL (ref 8.6–10.2)
Chloride: 105 mmol/L (ref 98–110)
Creat: 0.67 mg/dL (ref 0.50–0.96)
Globulin: 2.9 g/dL (calc) (ref 1.9–3.7)
Glucose, Bld: 83 mg/dL (ref 65–99)
Potassium: 4.6 mmol/L (ref 3.5–5.3)
Sodium: 140 mmol/L (ref 135–146)
Total Bilirubin: 0.4 mg/dL (ref 0.2–1.2)
Total Protein: 7.5 g/dL (ref 6.1–8.1)

## 2022-04-23 LAB — THYROID PANEL WITH TSH
Free Thyroxine Index: 2.2 (ref 1.4–3.8)
T3 Uptake: 28 % (ref 22–35)
T4, Total: 8 ug/dL (ref 5.1–11.9)
TSH: 1.64 mIU/L

## 2022-04-23 LAB — B12 AND FOLATE PANEL
Folate: >24 ng/mL
Vitamin B-12: 695 pg/mL (ref 200–1100)

## 2022-04-23 LAB — VITAMIN D 25 HYDROXY (VIT D DEFICIENCY, FRACTURES): Vit D, 25-Hydroxy: 44 ng/mL (ref 30–100)

## 2022-04-23 LAB — FERRITIN: Ferritin: 34 ng/mL (ref 16–154)

## 2022-04-25 LAB — CYTOLOGY - PAP
Adequacy: ABSENT
Diagnosis: NEGATIVE

## 2022-04-28 ENCOUNTER — Other Ambulatory Visit: Payer: Commercial Managed Care - HMO

## 2022-04-28 DIAGNOSIS — Z789 Other specified health status: Secondary | ICD-10-CM

## 2022-04-29 ENCOUNTER — Encounter: Payer: Self-pay | Admitting: *Deleted

## 2022-04-29 LAB — DHEA-SULFATE: DHEA-SO4: 212 ug/dL (ref 14–349)

## 2022-05-01 LAB — PROLACTIN: Prolactin: 4.5 ng/mL

## 2022-05-01 LAB — FOLLICLE STIMULATING HORMONE: FSH: 6.1 m[IU]/mL

## 2022-05-01 LAB — TESTOS,TOTAL,FREE AND SHBG (FEMALE)
Free Testosterone: 6.1 pg/mL (ref 0.1–6.4)
Sex Hormone Binding: 23 nmol/L (ref 17–124)
Testosterone, Total, LC-MS-MS: 40 ng/dL (ref 2–45)

## 2022-05-01 LAB — LUTEINIZING HORMONE: LH: 15 m[IU]/mL

## 2022-05-06 ENCOUNTER — Encounter: Payer: Self-pay | Admitting: Nurse Practitioner

## 2022-05-06 ENCOUNTER — Ambulatory Visit
Admission: EM | Admit: 2022-05-06 | Discharge: 2022-05-06 | Disposition: A | Discharge status: Home / Self Care | Payer: Commercial Managed Care - HMO | Attending: Physician Assistant | Admitting: Physician Assistant

## 2022-05-06 DIAGNOSIS — J0100 Acute maxillary sinusitis, unspecified: Secondary | ICD-10-CM

## 2022-05-06 MED ORDER — AMOXICILLIN-POT CLAVULANATE 875-125 MG PO TABS: 1.0000 | ORAL_TABLET | Freq: Two times a day (BID) | ORAL | 0 refills | Status: AC

## 2022-05-06 NOTE — ED Triage Notes (Signed)
Patient presents to UC for left sided facial pain, HA, left ear pressure, and nasal congestion  x 2 days ago. Taking sinus med.   Denies fever.

## 2022-05-06 NOTE — ED Provider Notes (Signed)
[facility]    CSN: [id number] Arrival date & time: 05/06/22  1636      History   Chief Complaint Chief Complaint  Patient presents with   Ear Fullness   Headache   Facial Pain   Nasal Congestion    HPI Barbara Newman is a 26 y.o. female.   Patient presents today for evaluation of left sided chest pain and headache that started last night. She reports that she has had congestion for a few days. She has not had fever or body aches. She denies any nausea, vomiting or diarrhea. She has tried sinus medication which did help congestion.   The history is provided by the patient.  Ear Fullness Associated symptoms include headaches. Pertinent negatives include no shortness of breath.  Headache Associated symptoms: congestion, ear pain, sinus pressure and sore throat   Associated symptoms: no cough, no diarrhea, no fever, no myalgias, no nausea and no vomiting     Past Medical History:  Diagnosis Date   Vitamin D deficiency     Patient Active Problem List   Diagnosis Date Noted   Syncope and collapse 04/18/2022   Family history of premature CAD 04/18/2022   Encounter for assisted reproductive fertility cycle 04/18/2022   Dyslipidemia 04/18/2022    Past Surgical History:  Procedure Laterality Date   SKIN BIOPSY      OB History     Gravida  0   Para  0   Term  0   Preterm  0   AB  0   Living  0      SAB  0   IAB  0   Ectopic  0   Multiple  0   Live Births  0            Home Medications    Prior to Admission medications   Medication Sig Start Date End Date Taking? Authorizing Provider  amoxicillin-clavulanate (AUGMENTIN) 875-125 MG tablet Take 1 tablet by mouth every 12 (twelve) hours. 05/06/22  Yes Francene Finders, PA-C  metFORMIN (GLUCOPHAGE-XR) 500 MG 24 hr tablet Take 500 mg by mouth daily. 01/17/22   [provider]  Omega-3 Fatty Acids (FISH OIL PO) Take 1 capsule by mouth at bedtime.    [provider]   Prenatal Vit-DSS-Fe Fum-FA (PRENATAL 19) tablet Take 1 tablet by mouth daily. 11/19/19   [provider]    Family History Family History  Problem Relation Age of Onset   Hypertension Mother    Early death Mother 97   Heart disease Mother    Heart attack Mother    Heart disease Maternal Aunt    Early death Maternal Aunt 40   Diabetes Paternal Uncle    Heart disease Maternal Grandmother 62   Diabetes Paternal Grandfather     Social History Social History   Tobacco Use   Smoking status: Never   Smokeless tobacco: Never  Vaping Use   Vaping Use: Never used  Substance Use Topics   Alcohol use: Never   Drug use: Never     Allergies   Pork-derived products   Review of Systems Review of Systems  Constitutional:  Negative for chills and fever.  HENT:  Positive for congestion, ear pain, sinus pressure and sore throat.   Eyes:  Negative for discharge and redness.  Respiratory:  Negative for cough, shortness of breath and wheezing.   Gastrointestinal:  Negative for diarrhea, nausea and vomiting.  Musculoskeletal:  Negative for myalgias.  Neurological:  Positive for headaches.     Physical Exam Triage Vital Signs ED Triage Vitals  Enc Vitals Group     BP 05/06/22 1743 116/73     Pulse Rate 05/06/22 1743 87     Resp 05/06/22 1743 16     Temp 05/06/22 1743 97.8 F (36.6 C)     Temp Source 05/06/22 1743 Oral     SpO2 05/06/22 1743 98 %     Weight --      Height --      Head Circumference --      Peak Flow --      Pain Score 05/06/22 1812 0     Pain Loc --      Pain Edu? --      Excl. in GC? --    No data found.  Updated Vital Signs BP 116/73 (BP Location: Left Arm)   Pulse 87   Temp 97.8 F (36.6 C) (Oral)   Resp 16   LMP 04/12/2022 (Exact Date)   SpO2 98%       Physical Exam Vitals and nursing note reviewed.  Constitutional:      General: She is not in acute distress.    Appearance: Normal appearance. She is not ill-appearing.  HENT:      Head: Normocephalic and atraumatic.     Ears:     Comments: Left TM retracted    Nose: Congestion present.     Mouth/Throat:     Mouth: Mucous membranes are moist.     Pharynx: No oropharyngeal exudate or posterior oropharyngeal erythema.  Eyes:     Conjunctiva/sclera: Conjunctivae normal.  Cardiovascular:     Rate and Rhythm: Normal rate and regular rhythm.     Heart sounds: Normal heart sounds. No murmur heard. Pulmonary:     Effort: Pulmonary effort is normal. No respiratory distress.     Breath sounds: Normal breath sounds. No wheezing, rhonchi or rales.  Skin:    General: Skin is warm and dry.  Neurological:     Mental Status: She is alert.  Psychiatric:        Mood and Affect: Mood normal.        Thought Content: Thought content normal.      UC Treatments / Results  Labs (all labs ordered are listed, but only abnormal results are displayed) Labs Reviewed - No data to display  EKG   Radiology No results found.  Procedures Procedures (including critical care time)  Medications Ordered in UC Medications - No data to display  Initial Impression / Assessment and Plan / UC Course  I have reviewed the triage vital signs and the nursing notes.  Pertinent labs & imaging results that were available during my care of the patient were reviewed by me and considered in my medical decision making (see chart for details).    Augmentin prescribed to cover possible sinusitis given significant facial pain and headache. Discussed that symptoms could also be viral in nature and recommended follow up if no improvement or with any further concerns. Ok to continue sinus med if needed with antibiotic.   Final Clinical Impressions(s) / UC Diagnoses   Final diagnoses:  Acute maxillary sinusitis, recurrence not specified   Discharge Instructions   None    ED Prescriptions     Medication Sig Dispense Auth. Provider   amoxicillin-clavulanate (AUGMENTIN) 875-125 MG tablet Take  1 tablet by mouth every 12 (twelve) hours. 14 tablet Tomi Bamberger, PA-C  PDMP not reviewed this encounter.   Tomi Bamberger, PA-C 05/06/22 1825

## 2022-05-12 ENCOUNTER — Other Ambulatory Visit: Payer: Commercial Managed Care - HMO
